# Patient Record
Sex: Female | Born: 1945 | Race: White | Hispanic: No | Smoking: Former smoker
Health system: Southern US, Community
[De-identification: ages and names within clinical notes are randomized; demographics above are authoritative.]

## PROBLEM LIST (undated history)

## (undated) DIAGNOSIS — K219 Gastro-esophageal reflux disease without esophagitis: Secondary | ICD-10-CM

## (undated) DIAGNOSIS — F0390 Unspecified dementia without behavioral disturbance: Secondary | ICD-10-CM

## (undated) DIAGNOSIS — E119 Type 2 diabetes mellitus without complications: Secondary | ICD-10-CM

## (undated) DIAGNOSIS — F29 Unspecified psychosis not due to a substance or known physiological condition: Secondary | ICD-10-CM

## (undated) HISTORY — PX: HIP ARTHROPLASTY: SHX981

## (undated) HISTORY — PX: JOINT REPLACEMENT: SHX530

## (undated) HISTORY — PX: CYSTECTOMY: SUR359

---

## 2005-05-05 ENCOUNTER — Emergency Department (HOSPITAL_COMMUNITY): Admission: EM | Admit: 2005-05-05 | Discharge: 2005-05-05 | Payer: Self-pay | Admitting: Emergency Medicine

## 2006-10-27 ENCOUNTER — Emergency Department (HOSPITAL_COMMUNITY): Admission: EM | Admit: 2006-10-27 | Discharge: 2006-10-28 | Payer: Self-pay | Admitting: Emergency Medicine

## 2007-01-13 ENCOUNTER — Emergency Department (HOSPITAL_COMMUNITY): Admission: EM | Admit: 2007-01-13 | Discharge: 2007-01-13 | Payer: Self-pay | Admitting: Emergency Medicine

## 2007-04-17 ENCOUNTER — Emergency Department (HOSPITAL_COMMUNITY): Admission: EM | Admit: 2007-04-17 | Discharge: 2007-04-17 | Payer: Self-pay | Admitting: Emergency Medicine

## 2007-05-02 ENCOUNTER — Encounter: Admission: RE | Admit: 2007-05-02 | Discharge: 2007-05-02 | Payer: Self-pay | Admitting: Internal Medicine

## 2007-09-26 ENCOUNTER — Ambulatory Visit (HOSPITAL_COMMUNITY): Admission: RE | Admit: 2007-09-26 | Discharge: 2007-09-26 | Payer: Self-pay | Admitting: Internal Medicine

## 2008-02-09 ENCOUNTER — Ambulatory Visit: Payer: Self-pay | Admitting: Pulmonary Disease

## 2008-02-09 DIAGNOSIS — J309 Allergic rhinitis, unspecified: Secondary | ICD-10-CM | POA: Insufficient documentation

## 2008-02-09 DIAGNOSIS — E119 Type 2 diabetes mellitus without complications: Secondary | ICD-10-CM

## 2008-02-09 DIAGNOSIS — E785 Hyperlipidemia, unspecified: Secondary | ICD-10-CM

## 2008-02-09 DIAGNOSIS — R0602 Shortness of breath: Secondary | ICD-10-CM

## 2008-02-09 DIAGNOSIS — R05 Cough: Secondary | ICD-10-CM

## 2008-02-19 ENCOUNTER — Ambulatory Visit: Payer: Self-pay | Admitting: Pulmonary Disease

## 2008-02-24 ENCOUNTER — Telehealth (INDEPENDENT_AMBULATORY_CARE_PROVIDER_SITE_OTHER): Payer: Self-pay | Admitting: *Deleted

## 2008-02-29 ENCOUNTER — Emergency Department (HOSPITAL_COMMUNITY): Admission: EM | Admit: 2008-02-29 | Discharge: 2008-02-29 | Payer: Self-pay | Admitting: Emergency Medicine

## 2008-03-22 ENCOUNTER — Ambulatory Visit: Payer: Self-pay | Admitting: Pulmonary Disease

## 2008-03-24 ENCOUNTER — Encounter: Admission: RE | Admit: 2008-03-24 | Discharge: 2008-04-23 | Payer: Self-pay | Admitting: Orthopedic Surgery

## 2008-07-03 ENCOUNTER — Emergency Department (HOSPITAL_COMMUNITY): Admission: EM | Admit: 2008-07-03 | Discharge: 2008-07-03 | Payer: Self-pay | Admitting: Emergency Medicine

## 2008-07-19 ENCOUNTER — Emergency Department (HOSPITAL_COMMUNITY): Admission: EM | Admit: 2008-07-19 | Discharge: 2008-07-19 | Payer: Self-pay | Admitting: Emergency Medicine

## 2008-07-20 ENCOUNTER — Emergency Department (HOSPITAL_COMMUNITY): Admission: EM | Admit: 2008-07-20 | Discharge: 2008-07-20 | Payer: Self-pay | Admitting: Emergency Medicine

## 2008-08-13 ENCOUNTER — Encounter: Admission: RE | Admit: 2008-08-13 | Discharge: 2008-08-13 | Payer: Self-pay | Admitting: Internal Medicine

## 2008-10-14 ENCOUNTER — Encounter: Admission: RE | Admit: 2008-10-14 | Discharge: 2008-11-17 | Payer: Self-pay | Admitting: Chiropractic Medicine

## 2008-10-27 ENCOUNTER — Ambulatory Visit (HOSPITAL_COMMUNITY): Admission: RE | Admit: 2008-10-27 | Discharge: 2008-10-27 | Payer: Self-pay | Admitting: Internal Medicine

## 2009-04-14 ENCOUNTER — Encounter: Admission: RE | Admit: 2009-04-14 | Discharge: 2009-04-14 | Payer: Self-pay | Admitting: Internal Medicine

## 2009-05-13 ENCOUNTER — Encounter: Admission: RE | Admit: 2009-05-13 | Discharge: 2009-05-13 | Payer: Self-pay | Admitting: Internal Medicine

## 2009-09-06 ENCOUNTER — Emergency Department (HOSPITAL_COMMUNITY): Admission: EM | Admit: 2009-09-06 | Discharge: 2009-09-06 | Payer: Self-pay | Admitting: Emergency Medicine

## 2009-09-09 ENCOUNTER — Encounter: Admission: RE | Admit: 2009-09-09 | Discharge: 2009-09-09 | Payer: Self-pay | Admitting: Internal Medicine

## 2009-10-31 ENCOUNTER — Ambulatory Visit (HOSPITAL_COMMUNITY): Admission: RE | Admit: 2009-10-31 | Discharge: 2009-10-31 | Payer: Self-pay | Admitting: Internal Medicine

## 2009-11-28 ENCOUNTER — Emergency Department (HOSPITAL_COMMUNITY): Admission: EM | Admit: 2009-11-28 | Discharge: 2009-11-28 | Payer: Self-pay | Admitting: Emergency Medicine

## 2010-01-07 IMAGING — CR DG SHOULDER 2+V*L*
4 series · 4 of 4 positions shown · non-contrast
Comparison: None.

CLINICAL DATA: 62-year-old female left shoulder pain

LEFT SHOULDER - 2+ VIEW

[w shoulder ap external left]
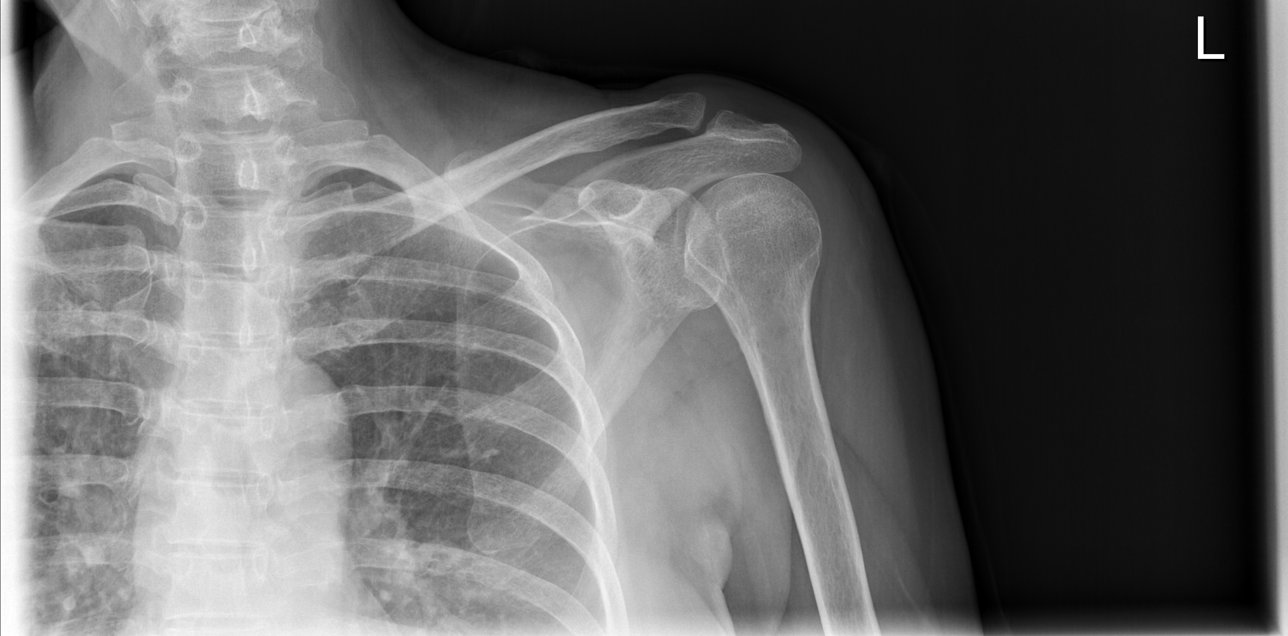

[w shoulder ap internal left]
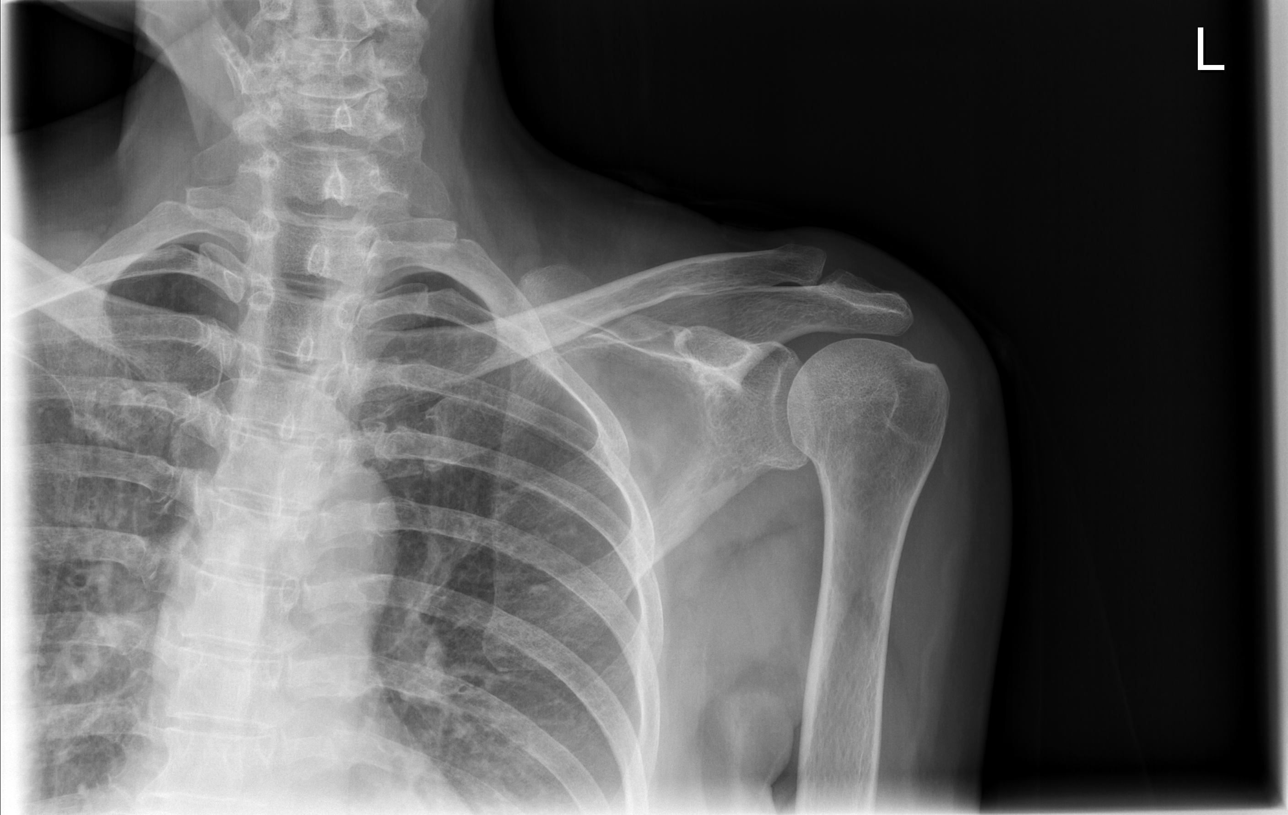

[w shoulder y view left (1 of 2)]
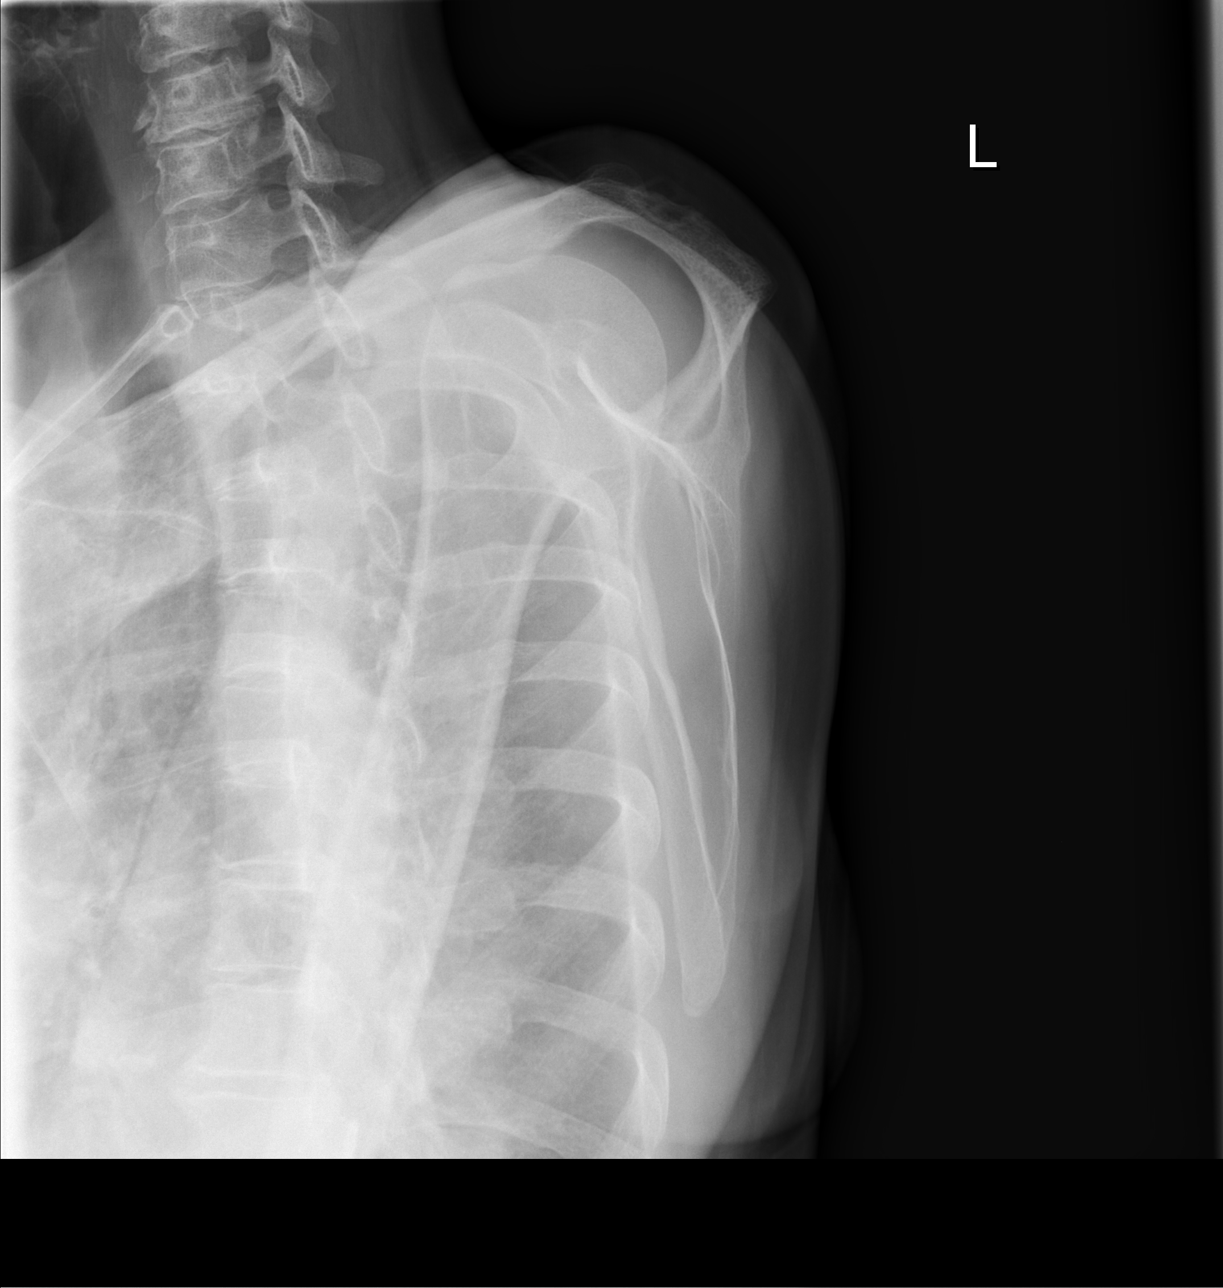

[w shoulder y view left (2 of 2)]
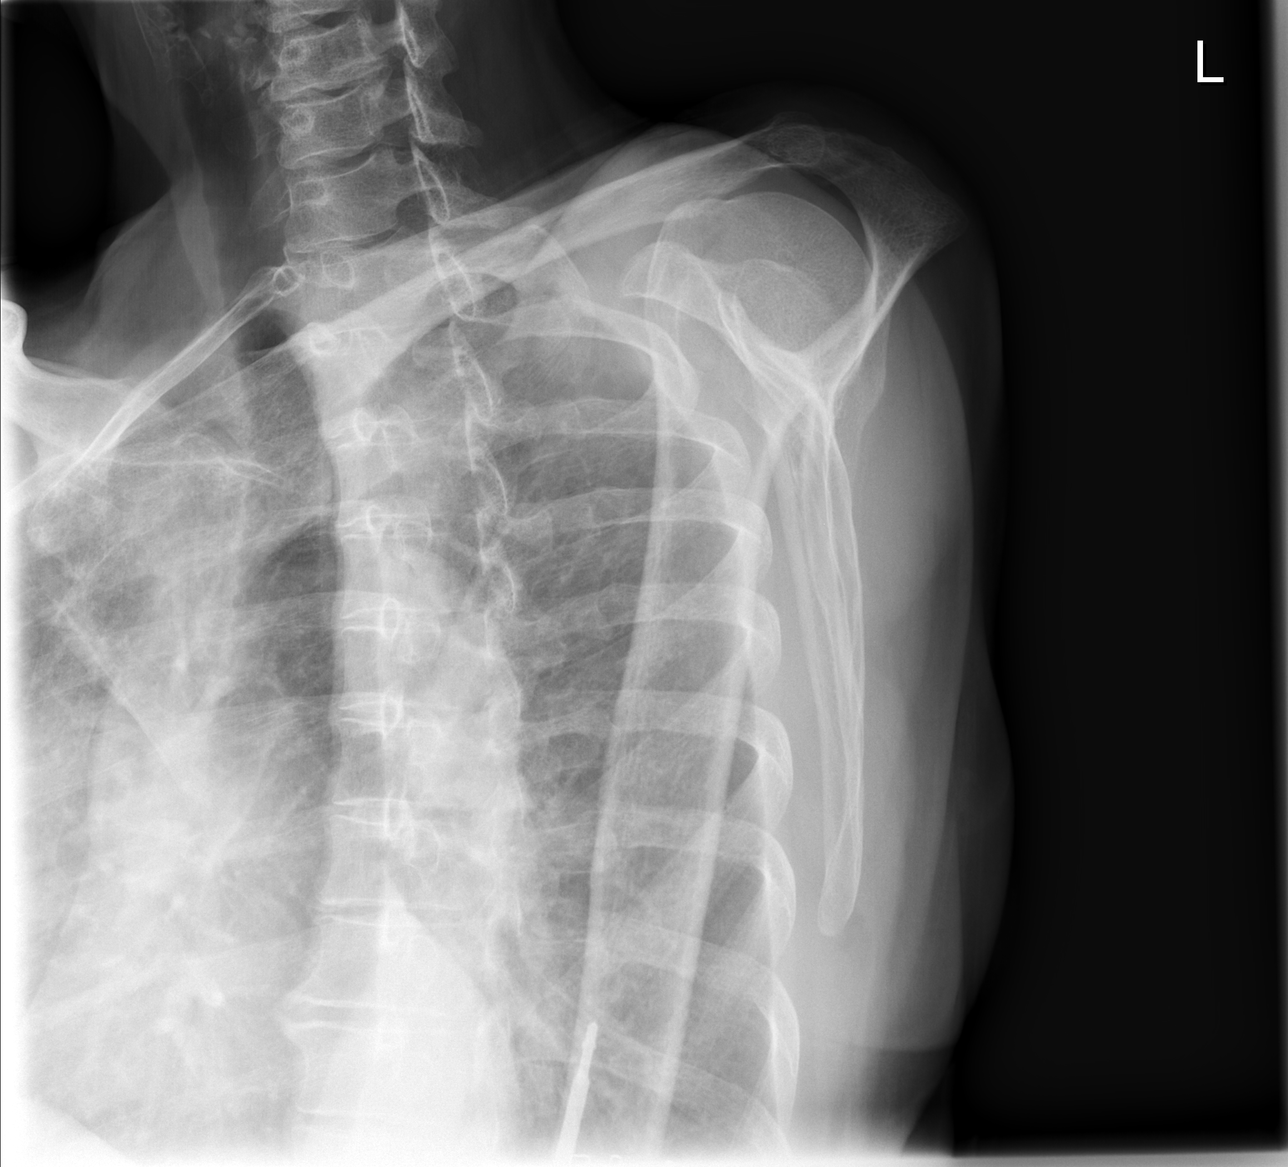

[4 of 4 positions shown; findings below may reference images not displayed]

FINDINGS: Normal alignment without displaced fracture, subluxation
or dislocation.  Intact AC joint and scapula.
IMPRESSION: No acute finding.

## 2010-01-23 IMAGING — CR DG ANKLE COMPLETE 3+V*L*
3 series · 3 of 3 positions shown · non-contrast
Comparison: None.

CLINICAL DATA: Left ankle pain following a fall last night.

LEFT ANKLE COMPLETE - 3+ VIEW

[t ankle joint ap left]
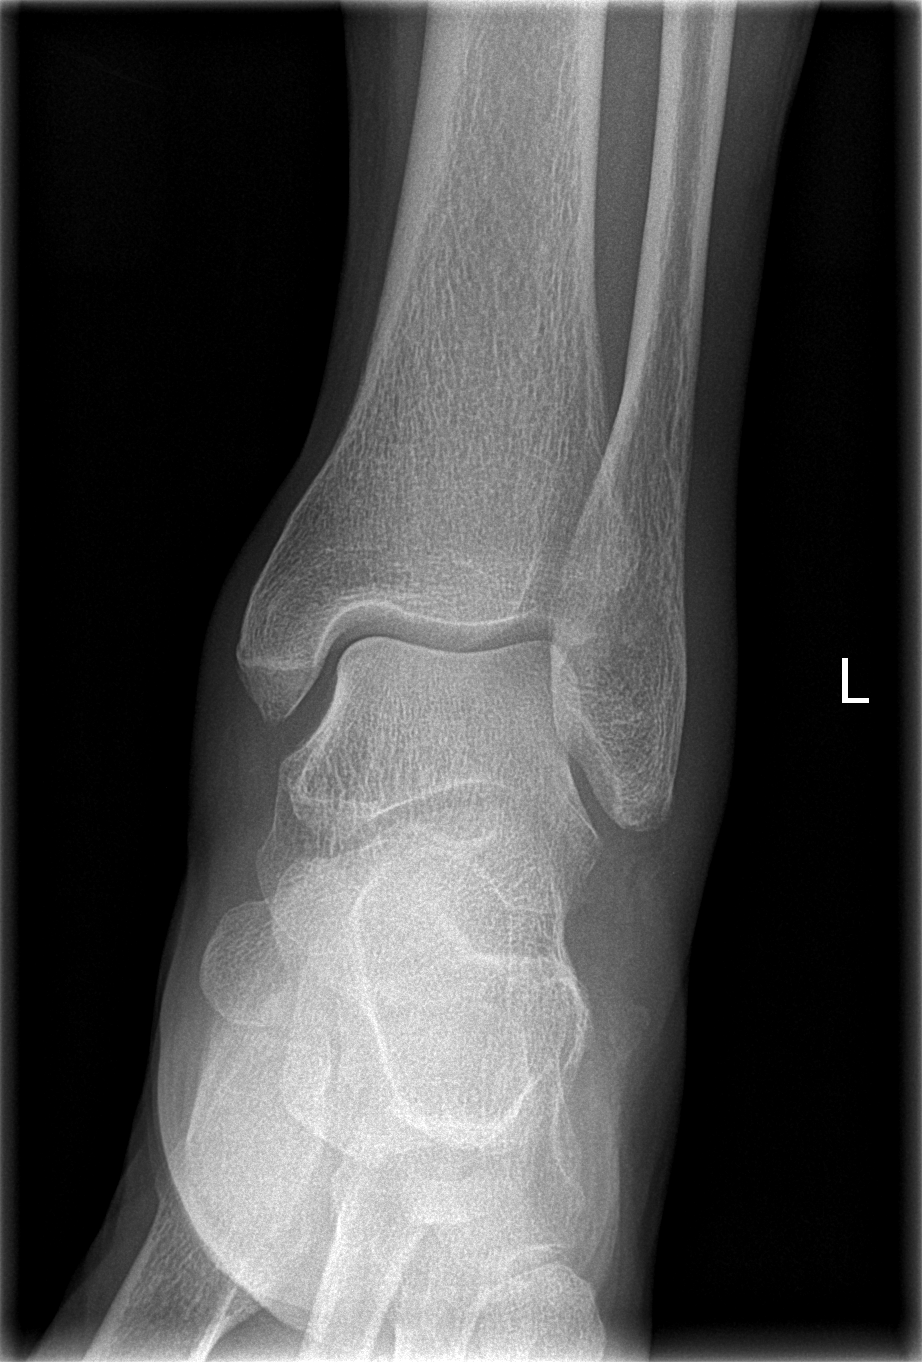

[t ankle joint oblique left]
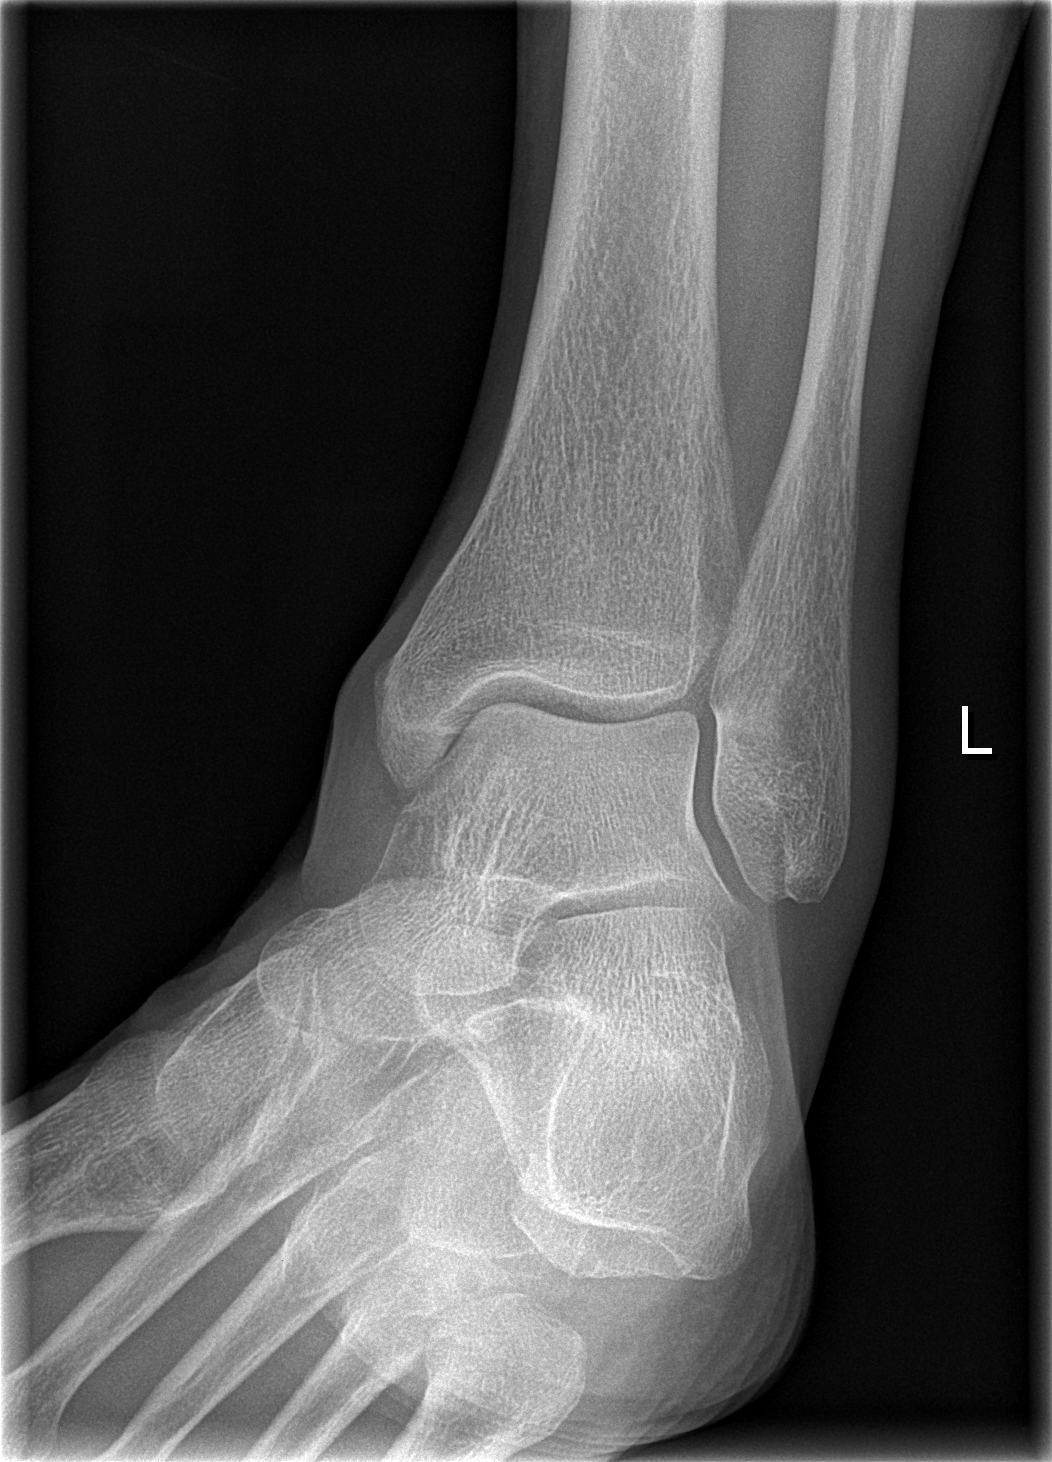

[t ankle joint lat left]
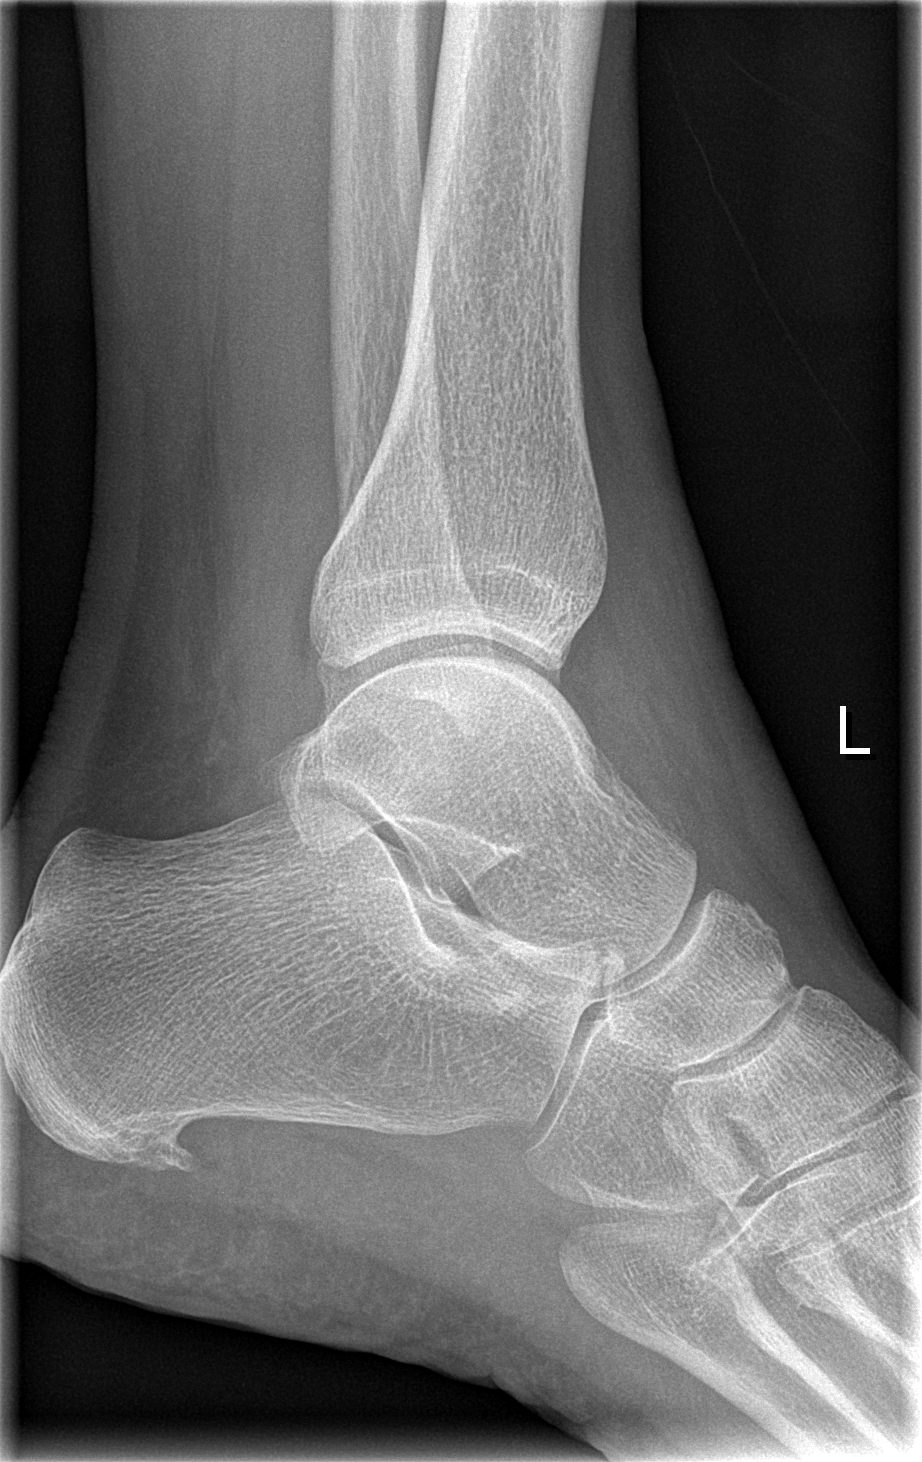

[3 of 3 positions shown; findings below may reference images not displayed]

FINDINGS: Mild diffuse soft tissue swelling, most pronounced
laterally.  No fracture, dislocation or effusion seen.  Moderate
sized inferior calcaneal spur.
IMPRESSION: No fracture.

## 2010-02-22 ENCOUNTER — Encounter: Admission: RE | Admit: 2010-02-22 | Discharge: 2010-02-22 | Payer: Self-pay | Admitting: Internal Medicine

## 2010-08-16 ENCOUNTER — Observation Stay (HOSPITAL_COMMUNITY)
Admission: EM | Admit: 2010-08-16 | Discharge: 2010-08-17 | Payer: Self-pay | Source: Home / Self Care | Admitting: Emergency Medicine

## 2010-12-19 LAB — GLUCOSE, CAPILLARY
Glucose-Capillary: 113 mg/dL — ABNORMAL HIGH (ref 70–99)
Glucose-Capillary: 89 mg/dL (ref 70–99)

## 2010-12-19 LAB — COMPREHENSIVE METABOLIC PANEL
Albumin: 3.3 g/dL — ABNORMAL LOW (ref 3.5–5.2)
Alkaline Phosphatase: 61 U/L (ref 39–117)
BUN: 7 mg/dL (ref 6–23)
CO2: 24 mEq/L (ref 19–32)
Chloride: 110 mEq/L (ref 96–112)
Creatinine, Ser: 0.69 mg/dL (ref 0.4–1.2)
GFR calc non Af Amer: >60 mL/min (ref >60)
Glucose, Bld: 104 mg/dL — ABNORMAL HIGH (ref 70–99)
Potassium: 4 mEq/L (ref 3.5–5.1)
Total Bilirubin: 0.3 mg/dL (ref 0.3–1.2)

## 2010-12-19 LAB — CBC
Hemoglobin: 12.1 g/dL (ref 12.0–15.0)
MCH: 32.7 pg (ref 26.0–34.0)
MCHC: 34.4 g/dL (ref 30.0–36.0)
Platelets: 251 10*3/uL (ref 150–400)
RBC: 3.7 MIL/uL — ABNORMAL LOW (ref 3.87–5.11)

## 2010-12-19 LAB — URINALYSIS, ROUTINE W REFLEX MICROSCOPIC
Glucose, UA: NEGATIVE mg/dL
Hgb urine dipstick: NEGATIVE
Ketones, ur: NEGATIVE mg/dL
Protein, ur: NEGATIVE mg/dL
Urobilinogen, UA: 0.2 mg/dL (ref 0.0–1.0)

## 2010-12-19 LAB — DIFFERENTIAL
Basophils Relative: 0 % (ref 0–1)
Eosinophils Absolute: 0.2 10*3/uL (ref 0.0–0.7)
Lymphs Abs: 1.8 10*3/uL (ref 0.7–4.0)
Monocytes Relative: 10 % (ref 3–12)
Neutro Abs: 6.9 10*3/uL (ref 1.7–7.7)
Neutrophils Relative %: 70 % (ref 43–77)

## 2010-12-19 LAB — CARDIAC PANEL(CRET KIN+CKTOT+MB+TROPI)
Relative Index: 3.6 — ABNORMAL HIGH (ref 0.0–2.5)
Relative Index: INVALID (ref 0.0–2.5)
Total CK: 107 U/L (ref 7–177)
Troponin I: 0.08 ng/mL — ABNORMAL HIGH (ref 0.00–0.06)
Troponin I: 0.12 ng/mL — ABNORMAL HIGH (ref 0.00–0.06)

## 2010-12-19 LAB — BASIC METABOLIC PANEL
Calcium: 9 mg/dL (ref 8.4–10.5)
Creatinine, Ser: 0.71 mg/dL (ref 0.4–1.2)
GFR calc Af Amer: >60 mL/min (ref >60)
GFR calc non Af Amer: >60 mL/min (ref >60)
Sodium: 141 mEq/L (ref 135–145)

## 2010-12-19 LAB — LIPID PANEL
Cholesterol: 108 mg/dL (ref 0–200)
HDL: 57 mg/dL (ref >39)

## 2010-12-19 LAB — HEMOGLOBIN A1C
Hgb A1c MFr Bld: 6 % — ABNORMAL HIGH (ref <5.7)
Mean Plasma Glucose: 126 mg/dL — ABNORMAL HIGH (ref <117)

## 2010-12-19 LAB — TSH: TSH: 1.271 u[IU]/mL (ref 0.350–4.500)

## 2011-07-24 LAB — URINALYSIS, ROUTINE W REFLEX MICROSCOPIC
Glucose, UA: NEGATIVE
Leukocytes, UA: NEGATIVE
Protein, ur: NEGATIVE
Specific Gravity, Urine: 1.009
Urobilinogen, UA: 0.2

## 2011-07-24 LAB — URINE MICROSCOPIC-ADD ON

## 2022-05-28 NOTE — H&P (Signed)
  Patient: Kathryn Michael  PID: [id number]  DOB: 28-Mar-1946  SEX: Female   Patient referred by DDS for extraction teeth  CC: per daughter- Needs extractions.   Past Medical History:  Asthma, Emphysema, Mental Health problems, Non-verbal, Dementia, Depression, Diabetes, Hyperlipidemia, GERD, COPD, Schizophrenia    Medications: Trihexyphenidyl, Omeprazole, Simvastatin, Vitamin D, topiramate, Bupropion, Trazodone, Metformin, Tylenol, Fish Oil, Perphenazine    Allergies:     Codeine    Surgeries:   hip replacement surgery, C Section, Tumor Removal                               Exam: Gross decay teeth # 2, 3, 4, 6, 7, 8, 10, 15, 17, 21, 22, 23, 24, 25, 26, 27, 28, 29, 30, 31, 32, decay under 4 unit bridge from #11-14,  No purulence, edema, fluctuance, trismus. Oral cancer screening negative. Pharynx clear. No lymphadenopathy.  PAXR: Gross decay all remaining teeth.  Assessment: ASA 3. Non-restorable   teeth secondary to dental caries x 23            Plan: 1. MD Clearance  2. Extraction all remaining teeth   Hospital Day surgery.                 Rx: n               Risks and complications explained. Questions answered.   Georgia Lopes, DMD

## 2022-05-31 ENCOUNTER — Encounter (HOSPITAL_COMMUNITY): Payer: Self-pay | Admitting: Oral Surgery

## 2022-05-31 NOTE — Progress Notes (Signed)
Surgical Instructions   Kathryn Michael's procedure is scheduled on 06/01/22 at 7:30 AM.  Report to [facility] Main Entrance "A" at 5:30 AM and then check in at the Admitting office.  Call this number if you have problems the morning of surgery: [phone]   Remember:  Do not eat or drink after midnight tonight.    Take these medicines the morning of surgery with A SIP OF WATER: Bupropion (Wellbutrin), Loratadine (Claritin), Omeprazole (Prilosec), Perphenazine (Trilafon), Topiramate (Topamax), Trihexyphenidyl (Artane)  Please hold Vitamins and Fish Oil as of now until after surgery.   Please hold Metformin in the AM.   Check patient's fasting blood sugar in the AM. If blood sugar is 70 or below, treat with 1/2 cup of clear juice (apple or cranberry) and recheck blood sugar 15 minutes after drinking juice. If blood sugar continues to be 70 or below have pt's daughter report it to the nurse on arrival.     Do not wear jewelry or makeup. Do not wear lotions, powders, perfumes or deodorant. Do not shave 48 hours prior to surgery.  Do not bring valuables to the hospital. Do not wear nail polish, gel polish, artificial nails, or any other type of covering on natural nails (fingers and toes) If you have artificial nails or gel coating that need to be removed by a nail salon, please have this removed prior to surgery. Artificial nails or gel coating may interfere with anesthesia's ability to adequately monitor your vital signs.   Contacts, glasses, hearing aids, dentures or partials may not be worn into surgery, please bring cases for these belongings.  Special instructions:    Oral Hygiene is also important to reduce your risk of infection.  Remember - BRUSH YOUR TEETH THE MORNING OF SURGERY WITH YOUR REGULAR TOOTHPASTE   [facility]- Preparing For Surgery  Before surgery, you can play an important role. Because skin is not sterile, your skin needs to be as free of germs as possible. You can  reduce the number of germs on your skin by washing with CHG (chlorahexidine gluconate) Soap before surgery or Dial soap.    Please do not use if you have an allergy to CHG or antibacterial soaps. If your skin becomes reddened/irritated stop using the CHG.  Do not shave (including legs and underarms) for at least 48 hours prior to first CHG shower. It is OK to shave your face.  Please follow these instructions carefully.  Shower the NIGHT BEFORE SURGERY and the MORNING OF SURGERY with CHG Soap.   If you chose to wash your hair, wash your hair first as usual with your normal shampoo. After you shampoo, rinse your hair and body thoroughly to remove the shampoo.  Then [employer] and genitals (private parts) with your normal soap and rinse thoroughly to remove soap.  After that Use CHG Soap as you would any other liquid soap. You can apply CHG directly to the skin and wash gently with a scrungie or a clean washcloth.   Apply the CHG Soap to your body ONLY FROM THE NECK DOWN.  Do not use on open wounds or open sores. Avoid contact with your eyes, ears, mouth and genitals (private parts). Wash Face and genitals (private parts)  with your normal soap.   Wash thoroughly, paying special attention to the area where your surgery will be performed.  Thoroughly rinse your body with warm water from the neck down.  DO NOT shower/wash with your normal soap after using and rinsing  off the CHG Soap.  Pat yourself dry with a CLEAN TOWEL.  Wear CLEAN PAJAMAS to bed the night before surgery  Place CLEAN SHEETS on your bed the night before your surgery  DO NOT SLEEP WITH PETS. Day of Surgery: Take a shower with CHG soap. Wear Clean/Comfortable clothing the morning of surgery Do not apply any deodorants/lotions.   Remember to brush your teeth WITH YOUR REGULAR TOOTHPASTE.

## 2022-05-31 NOTE — Progress Notes (Addendum)
Pt is a resident at [facility]. I spoke with Higinio Plan, Nursing supervisor. She states pt has severe dementia and is unable to follow directions. She states pt is sweet and is not combative, but needs guidance. Pt's daughter will be bringing her and Marylu Lund states a CNA will be with patient also. Pt is diabetic. Her last A1C was 7.6 on 02/07/22.  I have faxed the pre-op instructions to [facility].   I also spoke with pt's daughter, Amil Amen and gave her instructions on arrival. She voiced understanding.

## 2022-05-31 NOTE — Anesthesia Preprocedure Evaluation (Addendum)
Anesthesia Evaluation  Patient identified by MRN, date of birth, ID band Patient awake    Reviewed: Allergy & Precautions, NPO status , Patient's Chart, lab work & pertinent test results  Airway Mallampati: III  TM Distance: >3 FB Neck ROM: Full    Dental no notable dental hx. (+) Poor Dentition, Dental Advisory Given, Chipped, Missing   Pulmonary former smoker,    Pulmonary exam normal breath sounds clear to auscultation       Cardiovascular hypertension (182/83 in preop, no home meds), Normal cardiovascular exam Rhythm:Regular Rate:Normal     Neuro/Psych PSYCHIATRIC DISORDERS Dementia negative neurological ROS     GI/Hepatic Neg liver ROS, GERD  Controlled and Medicated,  Endo/Other  diabetes, Well Controlled, Type 2, Oral Hypoglycemic AgentsFS 234 in preop  Renal/GU negative Renal ROS  negative genitourinary   Musculoskeletal negative musculoskeletal ROS (+)   Abdominal   Peds  Hematology negative hematology ROS (+)   Anesthesia Other Findings   Reproductive/Obstetrics negative OB ROS                            Anesthesia Physical Anesthesia Plan  ASA: 3  Anesthesia Plan: General   Post-op Pain Management: Tylenol PO (pre-op)*   Induction: Intravenous  PONV Risk Score and Plan: 4 or greater and Ondansetron, Dexamethasone and Treatment may vary due to age or medical condition  Airway Management Planned: Nasal ETT  Additional Equipment: None  Intra-op Plan:   Post-operative Plan: Extubation in OR  Informed Consent: I have reviewed the patients History and Physical, chart, labs and discussed the procedure including the risks, benefits and alternatives for the proposed anesthesia with the patient or authorized representative who has indicated his/her understanding and acceptance.     Dental advisory given  Plan Discussed with: CRNA  Anesthesia Plan Comments:         Anesthesia Quick Evaluation

## 2022-06-01 ENCOUNTER — Encounter (HOSPITAL_COMMUNITY): Payer: Self-pay | Admitting: Oral Surgery

## 2022-06-01 ENCOUNTER — Ambulatory Visit (HOSPITAL_COMMUNITY): Payer: Medicare Other | Admitting: Certified Registered Nurse Anesthetist

## 2022-06-01 ENCOUNTER — Encounter (HOSPITAL_COMMUNITY)
Admission: RE | Disposition: A | Discharge status: Home / Self Care | Payer: Self-pay | Source: Home / Self Care | Attending: Oral Surgery

## 2022-06-01 ENCOUNTER — Ambulatory Visit (HOSPITAL_BASED_OUTPATIENT_CLINIC_OR_DEPARTMENT_OTHER): Payer: Medicare Other | Admitting: Certified Registered Nurse Anesthetist

## 2022-06-01 ENCOUNTER — Other Ambulatory Visit: Payer: Self-pay

## 2022-06-01 ENCOUNTER — Ambulatory Visit (HOSPITAL_COMMUNITY)
Admission: RE | Admit: 2022-06-01 | Discharge: 2022-06-01 | Disposition: A | Discharge status: Home / Self Care | Payer: Medicare Other | Attending: Oral Surgery | Admitting: Oral Surgery

## 2022-06-01 DIAGNOSIS — K219 Gastro-esophageal reflux disease without esophagitis: Secondary | ICD-10-CM | POA: Insufficient documentation

## 2022-06-01 DIAGNOSIS — Z87891 Personal history of nicotine dependence: Secondary | ICD-10-CM | POA: Diagnosis not present

## 2022-06-01 DIAGNOSIS — Z7984 Long term (current) use of oral hypoglycemic drugs: Secondary | ICD-10-CM | POA: Insufficient documentation

## 2022-06-01 DIAGNOSIS — K029 Dental caries, unspecified: Secondary | ICD-10-CM | POA: Insufficient documentation

## 2022-06-01 DIAGNOSIS — I10 Essential (primary) hypertension: Secondary | ICD-10-CM | POA: Diagnosis not present

## 2022-06-01 DIAGNOSIS — K0850 Unsatisfactory restoration of tooth, unspecified: Secondary | ICD-10-CM

## 2022-06-01 DIAGNOSIS — E119 Type 2 diabetes mellitus without complications: Secondary | ICD-10-CM | POA: Diagnosis not present

## 2022-06-01 HISTORY — DX: Type 2 diabetes mellitus without complications: E11.9

## 2022-06-01 HISTORY — DX: Unspecified dementia, unspecified severity, without behavioral disturbance, psychotic disturbance, mood disturbance, and anxiety: F03.90

## 2022-06-01 HISTORY — DX: Unspecified psychosis not due to a substance or known physiological condition: F29

## 2022-06-01 HISTORY — PX: TOOTH EXTRACTION: SHX859

## 2022-06-01 HISTORY — DX: Gastro-esophageal reflux disease without esophagitis: K21.9

## 2022-06-01 HISTORY — PX: ALVEOLOPLASTY: SHX5710

## 2022-06-01 LAB — CBC
HCT: 38.3 % (ref 36.0–46.0)
Hemoglobin: 12.8 g/dL (ref 12.0–15.0)
MCH: 30.2 pg (ref 26.0–34.0)
MCHC: 33.4 g/dL (ref 30.0–36.0)
MCV: 90.3 fL (ref 80.0–100.0)
Platelets: 293 10*3/uL (ref 150–400)
RBC: 4.24 MIL/uL (ref 3.87–5.11)
RDW: 11.9 % (ref 11.5–15.5)
WBC: 10.5 10*3/uL (ref 4.0–10.5)
nRBC: 0 % (ref 0.0–0.2)

## 2022-06-01 LAB — GLUCOSE, CAPILLARY
Glucose-Capillary: 234 mg/dL — ABNORMAL HIGH (ref 70–99)
Glucose-Capillary: 219 mg/dL — ABNORMAL HIGH (ref 70–99)

## 2022-06-01 LAB — BASIC METABOLIC PANEL
Anion gap: 9 (ref 5–15)
BUN: 7 mg/dL — ABNORMAL LOW (ref 8–23)
CO2: 24 mmol/L (ref 22–32)
Calcium: 9.4 mg/dL (ref 8.9–10.3)
Chloride: 104 mmol/L (ref 98–111)
Creatinine, Ser: 0.77 mg/dL (ref 0.44–1.00)
GFR, Estimated: >60 mL/min (ref >60)
Glucose, Bld: 218 mg/dL — ABNORMAL HIGH (ref 70–99)
Potassium: 3.6 mmol/L (ref 3.5–5.1)
Sodium: 137 mmol/L (ref 135–145)

## 2022-06-01 SURGERY — DENTAL RESTORATION/EXTRACTIONS
Anesthesia: General | Site: Mouth

## 2022-06-01 MED ORDER — ORAL CARE MOUTH RINSE
15.0000 mL | Freq: Once | OROMUCOSAL | Status: AC
Start: 2022-06-01 — End: 2022-06-01

## 2022-06-01 MED ORDER — CEFAZOLIN SODIUM-DEXTROSE 2-4 GM/100ML-% IV SOLN
2.0000 g | INTRAVENOUS | Status: AC
  Administered 2022-06-01: 2 g via INTRAVENOUS
  Filled 2022-06-01: qty 100

## 2022-06-01 MED ORDER — DEXMEDETOMIDINE (PRECEDEX) IN NS 20 MCG/5ML (4 MCG/ML) IV SYRINGE
PREFILLED_SYRINGE | INTRAVENOUS | Status: DC | PRN
  Administered 2022-06-01 (×4): 2 ug via INTRAVENOUS

## 2022-06-01 MED ORDER — ACETAMINOPHEN 500 MG PO TABS
1000.0000 mg | ORAL_TABLET | Freq: Once | ORAL | Status: AC
  Administered 2022-06-01: 1000 mg via ORAL
  Filled 2022-06-01: qty 2

## 2022-06-01 MED ORDER — FENTANYL CITRATE (PF) 250 MCG/5ML IJ SOLN
INTRAMUSCULAR | Status: DC | PRN
Start: 2022-06-01 — End: 2022-06-01
  Administered 2022-06-01 (×2): 50 ug via INTRAVENOUS

## 2022-06-01 MED ORDER — 0.9 % SODIUM CHLORIDE (POUR BTL) OPTIME
TOPICAL | Status: DC | PRN
  Administered 2022-06-01: 1000 mL

## 2022-06-01 MED ORDER — INSULIN ASPART 100 UNIT/ML IJ SOLN
0.0000 [IU] | INTRAMUSCULAR | Status: DC | PRN
  Administered 2022-06-01: 3 [IU] via SUBCUTANEOUS
  Filled 2022-06-01: qty 1

## 2022-06-01 MED ORDER — ONDANSETRON HCL 4 MG/2ML IJ SOLN
INTRAMUSCULAR | Status: DC | PRN
  Administered 2022-06-01: 4 mg via INTRAVENOUS

## 2022-06-01 MED ORDER — SODIUM CHLORIDE 0.9 % IR SOLN
Status: DC | PRN
  Administered 2022-06-01: 1000 mL

## 2022-06-01 MED ORDER — ONDANSETRON HCL 4 MG/2ML IJ SOLN
INTRAMUSCULAR | Status: AC
Start: 2022-06-01 — End: ?
  Filled 2022-06-01: qty 2

## 2022-06-01 MED ORDER — KETOROLAC TROMETHAMINE 15 MG/ML IJ SOLN
INTRAMUSCULAR | Status: DC | PRN
  Administered 2022-06-01: 7.5 mg via INTRAVENOUS

## 2022-06-01 MED ORDER — OXYMETAZOLINE HCL 0.05 % NA SOLN
NASAL | Status: DC | PRN
  Administered 2022-06-01: 1 via NASAL

## 2022-06-01 MED ORDER — LIDOCAINE 2% (20 MG/ML) 5 ML SYRINGE
INTRAMUSCULAR | Status: DC | PRN
  Administered 2022-06-01: 60 mg via INTRAVENOUS

## 2022-06-01 MED ORDER — DEXAMETHASONE SODIUM PHOSPHATE 10 MG/ML IJ SOLN
INTRAMUSCULAR | Status: AC
Start: 2022-06-01 — End: ?
  Filled 2022-06-01: qty 1

## 2022-06-01 MED ORDER — DEXMEDETOMIDINE HCL IN NACL 80 MCG/20ML IV SOLN
INTRAVENOUS | Status: AC
  Filled 2022-06-01: qty 20

## 2022-06-01 MED ORDER — CLINDAMYCIN HCL 150 MG PO CAPS: 150.0000 mg | ORAL_CAPSULE | Freq: Three times a day (TID) | ORAL | 0 refills | Status: AC

## 2022-06-01 MED ORDER — KETOROLAC TROMETHAMINE 30 MG/ML IJ SOLN
INTRAMUSCULAR | Status: AC
  Filled 2022-06-01: qty 1

## 2022-06-01 MED ORDER — OXYMETAZOLINE HCL 0.05 % NA SOLN
NASAL | Status: AC
  Filled 2022-06-01: qty 30

## 2022-06-01 MED ORDER — PROPOFOL 10 MG/ML IV BOLUS
INTRAVENOUS | Status: AC
  Filled 2022-06-01: qty 20

## 2022-06-01 MED ORDER — PROPOFOL 10 MG/ML IV BOLUS
INTRAVENOUS | Status: DC | PRN
  Administered 2022-06-01: 10 mg via INTRAVENOUS
  Administered 2022-06-01: 100 mg via INTRAVENOUS

## 2022-06-01 MED ORDER — FENTANYL CITRATE (PF) 250 MCG/5ML IJ SOLN
INTRAMUSCULAR | Status: AC
  Filled 2022-06-01: qty 5

## 2022-06-01 MED ORDER — SUGAMMADEX SODIUM 200 MG/2ML IV SOLN
INTRAVENOUS | Status: DC | PRN
  Administered 2022-06-01: 116.4 mg via INTRAVENOUS

## 2022-06-01 MED ORDER — CHLORHEXIDINE GLUCONATE 0.12 % MT SOLN
15.0000 mL | Freq: Once | OROMUCOSAL | Status: AC
  Administered 2022-06-01: 15 mL via OROMUCOSAL
  Filled 2022-06-01: qty 15

## 2022-06-01 MED ORDER — HYDROCODONE-ACETAMINOPHEN 5-325 MG PO TABS: 1.0000 | ORAL_TABLET | ORAL | 0 refills | Status: AC | PRN

## 2022-06-01 MED ORDER — LIDOCAINE-EPINEPHRINE 2 %-1:100000 IJ SOLN
INTRAMUSCULAR | Status: AC
  Filled 2022-06-01: qty 1

## 2022-06-01 MED ORDER — ROCURONIUM BROMIDE 10 MG/ML (PF) SYRINGE
PREFILLED_SYRINGE | INTRAVENOUS | Status: DC | PRN
  Administered 2022-06-01: 40 mg via INTRAVENOUS

## 2022-06-01 MED ORDER — LIDOCAINE-EPINEPHRINE 2 %-1:100000 IJ SOLN
INTRAMUSCULAR | Status: DC | PRN
  Administered 2022-06-01: 20 mL via INTRADERMAL

## 2022-06-01 MED ORDER — LACTATED RINGERS IV SOLN: INTRAVENOUS | Status: DC

## 2022-06-01 SURGICAL SUPPLY — 30 items
BLADE SURG 15 STRL LF DISP TIS (BLADE) ×2 IMPLANT
BLADE SURG 15 STRL SS (BLADE) ×1
BUR CROSS CUT FISSURE 1.6 (BURR) ×2 IMPLANT
BUR EGG ELITE 4.0 (BURR) ×2 IMPLANT
CANISTER SUCT 3000ML PPV (MISCELLANEOUS) ×1 IMPLANT
COVER SURGICAL LIGHT HANDLE (MISCELLANEOUS) ×1 IMPLANT
DRAPE U-SHAPE 76X120 STRL (DRAPES) ×1 IMPLANT
GAUZE PACKING FOLDED 2  STR (GAUZE/BANDAGES/DRESSINGS) ×1
GAUZE PACKING FOLDED 2 STR (GAUZE/BANDAGES/DRESSINGS) ×1 IMPLANT
GLOVE BIO SURGEON STRL SZ8 (GLOVE) ×1 IMPLANT
GOWN STRL REUS W/ TWL LRG LVL3 (GOWN DISPOSABLE) ×1 IMPLANT
GOWN STRL REUS W/ TWL XL LVL3 (GOWN DISPOSABLE) ×1 IMPLANT
GOWN STRL REUS W/TWL LRG LVL3 (GOWN DISPOSABLE) ×1
GOWN STRL REUS W/TWL XL LVL3 (GOWN DISPOSABLE) ×1
IV NS 1000ML (IV SOLUTION) ×1
IV NS 1000ML BAXH (IV SOLUTION) ×1 IMPLANT
KIT BASIN OR (CUSTOM PROCEDURE TRAY) ×1 IMPLANT
KIT TURNOVER KIT B (KITS) ×1 IMPLANT
NDL HYPO 25GX1X1/2 BEV (NEEDLE) ×4 IMPLANT
NEEDLE HYPO 25GX1X1/2 BEV (NEEDLE) ×2 IMPLANT
NS IRRIG 1000ML POUR BTL (IV SOLUTION) ×1 IMPLANT
PAD ARMBOARD 7.5X6 YLW CONV (MISCELLANEOUS) ×1 IMPLANT
SLEEVE IRRIGATION ELITE 7 (MISCELLANEOUS) ×1 IMPLANT
SPIKE FLUID TRANSFER (MISCELLANEOUS) ×1 IMPLANT
SUT CHROMIC 3 0 PS 2 (SUTURE) ×1 IMPLANT
SYR BULB IRRIG 60ML STRL (SYRINGE) ×1 IMPLANT
SYR CONTROL 10ML LL (SYRINGE) ×1 IMPLANT
TRAY ENT MC OR (CUSTOM PROCEDURE TRAY) ×1 IMPLANT
TUBING IRRIGATION (MISCELLANEOUS) ×1 IMPLANT
YANKAUER SUCT BULB TIP NO VENT (SUCTIONS) ×1 IMPLANT

## 2022-06-01 NOTE — Anesthesia Procedure Notes (Signed)
Procedure Name: Intubation Date/Time: 06/01/2022 7:27 AM  Performed by: Cy Blamer, CRNAPre-anesthesia Checklist: Patient identified, Emergency Drugs available, Suction available and Patient being monitored Patient Re-evaluated:Patient Re-evaluated prior to induction Oxygen Delivery Method: Circle system utilized Preoxygenation: Pre-oxygenation with 100% oxygen Induction Type: IV induction Ventilation: Mask ventilation without difficulty Laryngoscope Size: Miller and 2 Grade View: Grade I Nasal Tubes: Nasal prep performed, Nasal Rae, Magill forceps- large, utilized and Left Tube size: 7.0 mm Number of attempts: 1 Placement Confirmation: ETT inserted through vocal cords under direct vision, positive ETCO2 and breath sounds checked- equal and bilateral Secured at: 26 cm Tube secured with: Tape Dental Injury: Teeth and Oropharynx as per pre-operative assessment

## 2022-06-01 NOTE — Op Note (Signed)
06/01/2022  8:47 AM  PATIENT:  Velna Hatchet  76 y.o. female  PRE-OPERATIVE DIAGNOSIS:  NON-RESTORABLE TEETH NUMBERS TWO, THREE, FOUR,SIX, SEVEN, EIGHT, TEN, ELEVEN, FOURTEEN, FIFTEEN, TWENTY ONE, TWENTY TWO, TWENTY THREE, TWENTY FOUR TWENTY FIVE, TWENTY SIX, TWENTY SEVEN THIRTY, THIRTY ONE, THIRTY TWO, SEVENTEEN, TWENTY EIGHT, TWENTY NINE SECONDARY TO DENTAL CARIES  POST-OPERATIVE DIAGNOSIS:  SAME  PROCEDURE:  Procedure(s): DENTAL RESTORATION/EXTRACTIONS ALL REMAINING TEETH:  NUMBERS TWO, THREE, FOUR,SIX, SEVEN, EIGHT, TEN, ELEVEN, FOURTEEN, FIFTEEN, TWENTY ONE, TWENTY TWO, TWENTY THREE, TWENTY FOUR TWENTY FIVE, TWENTY SIX, TWENTY SEVEN THIRTY, THIRTY ONE, THIRTY TWO, SEVENTEEN, TWENTY EIGHT, TWENTY NINE. ALVEOLOPLASTY ALVEOLOPLASTY  SURGEON:  Surgeon(s): Ocie Doyne, DMD  ANESTHESIA:   local and general  EBL:  minimal  DRAINS: none   SPECIMEN:  No Specimen  COUNTS:  YES  PLAN OF CARE: Discharge to home after PACU  PATIENT DISPOSITION:  PACU - hemodynamically stable.   PROCEDURE DETAILS: Dictation # [id number]  Georgia Lopes, DMD 06/01/2022 8:47 AM

## 2022-06-01 NOTE — Transfer of Care (Signed)
Immediate Anesthesia Transfer of Care Note  Patient: Kathryn Michael  Procedure(s) Performed: DENTAL RESTORATION/EXTRACTIONS ALL REMAINING TEETH:  NUMBERS TWO, THREE, FOUR,SIX, SEVEN, EIGHT, TEN, ELEVEN, FOURTEEN, FIFTEEN, TWENTY ONE, TWENTY TWO, TWENTY THREE, TWENTY FOUR TWENTY FIVE, TWENTY SIX, TWENTY SEVEN THIRTY, THIRTY ONE, THIRTY TWO, SEVENTEEN, TWENTY EIGHT, TWENTY NINE. (Mouth) ALVEOLOPLASTY (Mouth)  Patient Location: PACU  Anesthesia Type:General  Level of Consciousness: drowsy, patient cooperative and responds to stimulation  Airway & Oxygen Therapy: Patient Spontanous Breathing and Patient connected to nasal cannula oxygen  Post-op Assessment: Report given to RN, Post -op Vital signs reviewed and stable, Patient moving all extremities X 4 and Patient able to stick tongue midline  Post vital signs: Reviewed  Last Vitals:  Vitals Value Taken Time  BP 170/86   Temp 98.3   Pulse 74   Resp 11   SpO2 99     Last Pain: There were no vitals filed for this visit.    Patients Stated Pain Goal: 0 (06/01/22 0657)  Complications: No notable events documented.

## 2022-06-01 NOTE — H&P (Signed)
H&P documentation  -History and Physical Reviewed  -Patient has been re-examined  -No change in the plan of care  Kathryn Michael  

## 2022-06-01 NOTE — Anesthesia Postprocedure Evaluation (Signed)
Anesthesia Post Note  Patient: Kathryn Michael  Procedure(s) Performed: DENTAL RESTORATION/EXTRACTIONS ALL REMAINING TEETH:  NUMBERS TWO, THREE, FOUR,SIX, SEVEN, EIGHT, TEN, ELEVEN, FOURTEEN, FIFTEEN, TWENTY ONE, TWENTY TWO, TWENTY THREE, TWENTY FOUR TWENTY FIVE, TWENTY SIX, TWENTY SEVEN THIRTY, THIRTY ONE, THIRTY TWO, SEVENTEEN, TWENTY EIGHT, TWENTY NINE. (Mouth) ALVEOLOPLASTY (Mouth)     Patient location during evaluation: PACU Anesthesia Type: General Level of consciousness: awake and alert, oriented and patient cooperative Pain management: pain level controlled Vital Signs Assessment: post-procedure vital signs reviewed and stable Respiratory status: spontaneous breathing, nonlabored ventilation and respiratory function stable Cardiovascular status: blood pressure returned to baseline and stable Postop Assessment: no apparent nausea or vomiting Anesthetic complications: no   No notable events documented.  Last Vitals:  Vitals:   06/01/22 0900 06/01/22 0915  BP: (!) 178/73 (!) 186/90  Pulse: 74 74  Resp: 12 10  Temp:    SpO2: 95% 95%    Last Pain:  Vitals:   06/01/22 0915  PainSc: Asleep                 Lannie Fields

## 2022-06-01 NOTE — Op Note (Signed)
Kathryn Michael, Kathryn Michael MEDICAL RECORD NO: [id number] ACCOUNT NO: [id number] DATE OF BIRTH: 1946/01/19 FACILITY: [facility] LOCATION: [facility] PHYSICIAN: Georgia Lopes, DDS  Operative Report   DATE OF PROCEDURE: 06/01/2022  PREOPERATIVE DIAGNOSIS:  Nonrestorable teeth numbers 2, 3, 4, 6, 7, 8, 10, 11, 14, 15, 17, 21, 22, 23, 24, 25, 26, 27, 28, 29, 30, 31, 32 secondary to dental caries.  POSTOPERATIVE DIAGNOSIS:  Nonrestorable teeth numbers 2, 3, 4, 6, 7, 8, 10, 11, 14, 15, 17, 21, 22, 23, 24, 25, 26, 27, 28, 29, 30, 31, 32 secondary to dental caries.  PROCEDURE:  Extraction of teeth numbers as per above, alveoplasty right and left maxilla and mandible.  SURGEON:  Georgia Lopes, DDS  ANESTHESIA:  General, nasal intubation.  Attending was [place].  DESCRIPTION OF PROCEDURE:  The patient was taken to the operating room and nasal intubation was placed.  The eyes were protected and the patient was draped for surgery.  Timeout was performed.  The posterior pharynx was suctioned and a throat pack was  placed.  2% lidocaine in 1:100,000 epinephrine was infiltrated in an inferior alveolar block on the right and left sides and in buccal and palatal infiltration of the maxilla.  A bite block was placed on the right side of the mouth and a sweetheart  retractor was used to retract the tongue.  A #15 blade was used to make an incision around the tooth fragment at #17.  Then, another incision was created at tooth #21 and carried forward to tooth #27, both buccally and lingually in the gingival sulcus.   The periosteum was reflected.  The teeth were elevated and using the 301 elevator and the rongeur, the teeth were extracted.  The sockets were curetted.  The periosteum was reflected to expose the alveolar crest, which was irregular.  The sockets were  curetted and multiple granulation cysts and abscesses were removed.  Alveoplasty was performed using the egg bur followed by the bone file.  Then, the area was  irrigated and closed with 3-0 chromic.  Then, the left maxilla was operated. The 15 blade was  used to make an incision around teeth numbers 14 and 15 and also around teeth numbers 11, 10, 8, 7, and 6.  The periosteum was reflected from around these teeth.  The teeth were elevated.  Tooth #15 was a small fragment and it was removed with the  rongeurs.  Tooth #14 fractured upon attempted removal, which required removal of bone with a [employer]ryker handpiece in order to remove this tooth.  The tooth was removed with the dental forceps.  Then, teeth numbers 11, 10, 9, 8, 7 and 6 were removed with the  301 elevator, dental forceps or rongeurs.  The sockets were curetted. Granulation tissue and periapical cyst were removed and then the bone was trimmed and smoothed with egg bur under irrigation followed by the bone file.  Then, the area was irrigated  and closed with 3-0 chromic.  The bite block and sweetheart retractor were repositioned to the other side of the mouth.  A 15 blade was used to make an incision around teeth numbers 28, 29, 30, 31, and 32.  The periosteum was reflected with a periosteal  elevator. The teeth were elevated with a 301 elevator.  Teeth numbers 28, 29, 30 and 31 were removed with rongeur and 301 elevator.  Tooth #32 crumbled upon attempted removal, so additional bone was removed around the root with the Stryker handpiece with  fissure bur.  Then, root was elevated and removed with 301 elevator.  The sockets were curetted.  The tissue was trimmed and the bone was smoothed with the egg bur followed by a bone file.  Then, the area was irrigated and closed with 3-0 chromic.   Then, in the right maxilla, a 15 blade was used to make an incision around 2, 3, 4.  The periosteum was reflected.  Tooth #2 fractured upon attempted removal with the forceps after elevating. Tooth numbers 3 and 4 were removed with the rongeurs.  Bone  was removed around tooth #2 in order to get a purchase point with the  forceps and then the tooth was removed.  Then, the sockets were curetted, debrided, irrigated and the bone was smoothed with the egg bur followed by the bone file and then closed with  3-0 chromic. Then, additional local anesthesia was administered.  The oral cavity was irrigated and suctioned and throat pack was removed.  The patient was left in care of anesthesia for extubation with plans for discharge home through day surgery.  ESTIMATED BLOOD LOSS:  Minimum.  COMPLICATIONS:  None.  SPECIMEN:  None.   VAI D: 06/01/2022 8:53:02 am T: 06/01/2022 9:09:00 am  JOB: [id number]/ [id number]

## 2022-06-02 ENCOUNTER — Encounter (HOSPITAL_COMMUNITY): Payer: Self-pay | Admitting: Oral Surgery

## 2022-08-08 DEATH — deceased
